# Patient Record
Sex: Male | Born: 1975 | Race: White | Hispanic: No | Marital: Married | Smoking: Former smoker
Health system: Southern US, Community
[De-identification: ages and names within clinical notes are randomized; demographics above are authoritative.]

## PROBLEM LIST (undated history)

## (undated) DIAGNOSIS — K219 Gastro-esophageal reflux disease without esophagitis: Secondary | ICD-10-CM

## (undated) HISTORY — PX: KNEE ARTHROSCOPY: SUR90

## (undated) HISTORY — PX: BILATERAL CARPAL TUNNEL RELEASE: SHX6508

---

## 2009-04-17 HISTORY — PX: SHOULDER ARTHROSCOPY: SHX128

## 2016-01-20 ENCOUNTER — Other Ambulatory Visit: Payer: Self-pay | Admitting: Surgery

## 2016-01-20 DIAGNOSIS — G8929 Other chronic pain: Secondary | ICD-10-CM

## 2016-01-20 DIAGNOSIS — M25512 Pain in left shoulder: Principal | ICD-10-CM

## 2016-01-24 ENCOUNTER — Other Ambulatory Visit: Payer: Self-pay | Admitting: Surgery

## 2016-01-24 ENCOUNTER — Other Ambulatory Visit (HOSPITAL_COMMUNITY): Payer: Self-pay | Admitting: Surgery

## 2016-01-24 DIAGNOSIS — S43432D Superior glenoid labrum lesion of left shoulder, subsequent encounter: Secondary | ICD-10-CM

## 2016-01-24 DIAGNOSIS — M25512 Pain in left shoulder: Principal | ICD-10-CM

## 2016-01-24 DIAGNOSIS — G8929 Other chronic pain: Secondary | ICD-10-CM

## 2016-02-02 ENCOUNTER — Ambulatory Visit
Admission: RE | Admit: 2016-02-02 | Discharge: 2016-02-02 | Disposition: A | Discharge status: Home / Self Care | Payer: BC Managed Care – PPO | Source: Ambulatory Visit | Attending: Surgery | Admitting: Surgery

## 2016-02-02 DIAGNOSIS — Z1389 Encounter for screening for other disorder: Secondary | ICD-10-CM | POA: Insufficient documentation

## 2016-02-02 DIAGNOSIS — G8929 Other chronic pain: Secondary | ICD-10-CM | POA: Diagnosis present

## 2016-02-02 DIAGNOSIS — M12812 Other specific arthropathies, not elsewhere classified, left shoulder: Secondary | ICD-10-CM | POA: Insufficient documentation

## 2016-02-02 DIAGNOSIS — M25512 Pain in left shoulder: Secondary | ICD-10-CM | POA: Diagnosis present

## 2016-02-02 DIAGNOSIS — S43432D Superior glenoid labrum lesion of left shoulder, subsequent encounter: Secondary | ICD-10-CM

## 2016-03-23 ENCOUNTER — Encounter
Admission: RE | Admit: 2016-03-23 | Discharge: 2016-03-23 | Disposition: A | Discharge status: Home / Self Care | Payer: BC Managed Care – PPO | Source: Ambulatory Visit | Attending: Surgery | Admitting: Surgery

## 2016-03-23 HISTORY — DX: Gastro-esophageal reflux disease without esophagitis: K21.9

## 2016-03-23 NOTE — Patient Instructions (Signed)
  Your procedure is scheduled on: 04-04-16 Report to Same Day Surgery 2nd floor medical mall [facility](Medical Mall Entrance-take elevator on left to 2nd floor.  Check in with surgery information desk.) To find out your arrival time please call [phone] between 1PM - 3PM on 04-03-16  Remember: Instructions that are not followed completely may result in serious medical risk, up to and including death, or upon the discretion of your surgeon and anesthesiologist your surgery may need to be rescheduled.    _x___ 1. Do not eat food or drink liquids after midnight. No gum chewing or hard candies.     __x__ 2. No Alcohol for 24 hours before or after surgery.   __x__3. No Smoking for 24 prior to surgery.   ____  4. Bring all medications with you on the day of surgery if instructed.    __x__ 5. Notify your doctor if there is any change in your medical condition     (cold, fever, infections).     Do not wear jewelry, make-up, hairpins, clips or nail polish.  Do not wear lotions, powders, or perfumes. You may wear deodorant.  Do not shave 48 hours prior to surgery. Men may shave face and neck.  Do not bring valuables to the hospital.    [facility] is not responsible for any belongings or valuables.               Contacts, dentures or bridgework may not be worn into surgery.  Leave your suitcase in the car. After surgery it may be brought to your room.  For patients admitted to the hospital, discharge time is determined by your treatment team.   Patients discharged the day of surgery will not be allowed to drive home.  You will need someone to drive you home and stay with you the night of your procedure.    Please read over the following fact sheets that you were given:   [facility] Preparing for Surgery and or MRSA Information   ____ Take these medicines the morning of surgery with A SIP OF WATER:    1. NONE  2.  3.  4.  5.  6.  ____Fleets enema or Magnesium Citrate as directed.   ____  Use CHG Soap or sage wipes as directed on instruction sheet   ____ Use inhalers on the day of surgery and bring to hospital day of surgery  ____ Stop metformin 2 days prior to surgery    ____ Take 1/2 of usual insulin dose the night before surgery and none on the morning of surgery.   ____ Stop Aspirin, Coumadin, Pllavix ,Eliquis, Effient, or Pradaxa  x__ Stop Anti-inflammatories such as Advil, Aleve, Ibuprofen, Motrin, Naproxen,          Naprosyn, Goodies powders or aspirin products. Ok to take Tylenol.   ____ Stop supplements until after surgery.    ____ Bring C-Pap to the hospital.

## 2016-03-27 ENCOUNTER — Inpatient Hospital Stay: Admission: RE | Admit: 2016-03-27 | Payer: BC Managed Care – PPO | Source: Ambulatory Visit

## 2016-04-04 ENCOUNTER — Ambulatory Visit
Admission: RE | Admit: 2016-04-04 | Discharge: 2016-04-04 | Disposition: A | Discharge status: Home / Self Care | Payer: BC Managed Care – PPO | Source: Ambulatory Visit | Attending: Surgery | Admitting: Surgery

## 2016-04-04 ENCOUNTER — Encounter: Payer: Self-pay | Admitting: *Deleted

## 2016-04-04 ENCOUNTER — Ambulatory Visit: Payer: BC Managed Care – PPO | Admitting: Anesthesiology

## 2016-04-04 ENCOUNTER — Encounter
Admission: RE | Disposition: A | Discharge status: Home / Self Care | Payer: Self-pay | Source: Ambulatory Visit | Attending: Surgery

## 2016-04-04 DIAGNOSIS — M75112 Incomplete rotator cuff tear or rupture of left shoulder, not specified as traumatic: Secondary | ICD-10-CM | POA: Insufficient documentation

## 2016-04-04 DIAGNOSIS — M7542 Impingement syndrome of left shoulder: Secondary | ICD-10-CM | POA: Diagnosis not present

## 2016-04-04 DIAGNOSIS — S43432A Superior glenoid labrum lesion of left shoulder, initial encounter: Secondary | ICD-10-CM | POA: Diagnosis not present

## 2016-04-04 DIAGNOSIS — M89512 Osteolysis, left shoulder: Secondary | ICD-10-CM | POA: Insufficient documentation

## 2016-04-04 DIAGNOSIS — Z87891 Personal history of nicotine dependence: Secondary | ICD-10-CM | POA: Diagnosis not present

## 2016-04-04 DIAGNOSIS — Z886 Allergy status to analgesic agent status: Secondary | ICD-10-CM | POA: Insufficient documentation

## 2016-04-04 DIAGNOSIS — X58XXXA Exposure to other specified factors, initial encounter: Secondary | ICD-10-CM | POA: Diagnosis not present

## 2016-04-04 DIAGNOSIS — K219 Gastro-esophageal reflux disease without esophagitis: Secondary | ICD-10-CM | POA: Insufficient documentation

## 2016-04-04 HISTORY — PX: SHOULDER ARTHROSCOPY WITH OPEN ROTATOR CUFF REPAIR: SHX6092

## 2016-04-04 SURGERY — ARTHROSCOPY, SHOULDER WITH REPAIR, ROTATOR CUFF, OPEN
Anesthesia: General | Laterality: Left | Wound class: Clean

## 2016-04-04 MED ORDER — OXYCODONE HCL 5 MG/5ML PO SOLN: 5.0000 mg | Freq: Once | ORAL | Status: DC | PRN

## 2016-04-04 MED ORDER — DEXAMETHASONE SODIUM PHOSPHATE 4 MG/ML IJ SOLN
INTRAMUSCULAR | Status: DC | PRN
  Administered 2016-04-04: 10 mg via INTRAVENOUS

## 2016-04-04 MED ORDER — FENTANYL CITRATE (PF) 100 MCG/2ML IJ SOLN
INTRAMUSCULAR | Status: DC | PRN
  Administered 2016-04-04: 250 ug via INTRAVENOUS

## 2016-04-04 MED ORDER — PHENYLEPHRINE HCL 10 MG/ML IJ SOLN
INTRAMUSCULAR | Status: DC | PRN
  Administered 2016-04-04 (×2): 100 ug via INTRAVENOUS

## 2016-04-04 MED ORDER — MIDAZOLAM HCL 2 MG/2ML IJ SOLN
INTRAMUSCULAR | Status: DC | PRN
  Administered 2016-04-04: 2 mg via INTRAVENOUS

## 2016-04-04 MED ORDER — OXYCODONE HCL 5 MG PO TABS: 5.0000 mg | ORAL_TABLET | Freq: Once | ORAL | Status: DC | PRN

## 2016-04-04 MED ORDER — HYDROMORPHONE HCL 1 MG/ML IJ SOLN
INTRAMUSCULAR | Status: DC | PRN
  Administered 2016-04-04: 2 mg via INTRAVENOUS

## 2016-04-04 MED ORDER — FENTANYL CITRATE (PF) 100 MCG/2ML IJ SOLN: 25.0000 ug | INTRAMUSCULAR | Status: DC | PRN

## 2016-04-04 MED ORDER — SUCCINYLCHOLINE CHLORIDE 20 MG/ML IJ SOLN
INTRAMUSCULAR | Status: DC | PRN
  Administered 2016-04-04: 100 mg via INTRAVENOUS

## 2016-04-04 MED ORDER — KETOROLAC TROMETHAMINE 30 MG/ML IJ SOLN
INTRAMUSCULAR | Status: DC | PRN
  Administered 2016-04-04: 30 mg via INTRAVENOUS

## 2016-04-04 MED ORDER — GLYCOPYRROLATE 0.2 MG/ML IJ SOLN
INTRAMUSCULAR | Status: DC | PRN
  Administered 2016-04-04: 0.4 mg via INTRAVENOUS

## 2016-04-04 MED ORDER — KETAMINE HCL 50 MG/ML IJ SOLN
INTRAMUSCULAR | Status: DC | PRN
  Administered 2016-04-04: 50 mg via INTRAVENOUS

## 2016-04-04 MED ORDER — ROCURONIUM BROMIDE 100 MG/10ML IV SOLN
INTRAVENOUS | Status: DC | PRN
  Administered 2016-04-04: 45 mg via INTRAVENOUS
  Administered 2016-04-04: 5 mg via INTRAVENOUS

## 2016-04-04 MED ORDER — FAMOTIDINE 20 MG PO TABS
ORAL_TABLET | ORAL | Status: AC
  Administered 2016-04-04: 20 mg via ORAL
  Filled 2016-04-04: qty 1

## 2016-04-04 MED ORDER — CEFAZOLIN SODIUM-DEXTROSE 2-4 GM/100ML-% IV SOLN
INTRAVENOUS | Status: AC
  Filled 2016-04-04: qty 100

## 2016-04-04 MED ORDER — BUPIVACAINE-EPINEPHRINE 0.5% -1:200000 IJ SOLN
INTRAMUSCULAR | Status: DC | PRN
  Administered 2016-04-04: 30 mL

## 2016-04-04 MED ORDER — LIDOCAINE HCL (PF) 4 % IJ SOLN
INTRAMUSCULAR | Status: DC | PRN
  Administered 2016-04-04: 4 mL via RESPIRATORY_TRACT

## 2016-04-04 MED ORDER — PROPOFOL 10 MG/ML IV BOLUS
INTRAVENOUS | Status: DC | PRN
Start: 2016-04-04 — End: 2016-04-04
  Administered 2016-04-04: 200 mg via INTRAVENOUS

## 2016-04-04 MED ORDER — NEOSTIGMINE METHYLSULFATE 10 MG/10ML IV SOLN
INTRAVENOUS | Status: DC | PRN
  Administered 2016-04-04: 3 mg via INTRAVENOUS

## 2016-04-04 MED ORDER — HYDROMORPHONE HCL 1 MG/ML IJ SOLN
INTRAMUSCULAR | Status: AC
  Filled 2016-04-04: qty 2

## 2016-04-04 MED ORDER — CEFAZOLIN SODIUM-DEXTROSE 2-4 GM/100ML-% IV SOLN
2.0000 g | Freq: Once | INTRAVENOUS | Status: AC
  Administered 2016-04-04: 2 g via INTRAVENOUS

## 2016-04-04 MED ORDER — EPINEPHRINE PF 1 MG/ML IJ SOLN
INTRAMUSCULAR | Status: AC
  Filled 2016-04-04: qty 3

## 2016-04-04 MED ORDER — LIDOCAINE HCL (CARDIAC) 20 MG/ML IV SOLN
INTRAVENOUS | Status: DC | PRN
  Administered 2016-04-04: 100 mg via INTRAVENOUS

## 2016-04-04 MED ORDER — LACTATED RINGERS IV SOLN
INTRAVENOUS | Status: DC
  Administered 2016-04-04: 50 mL/h via INTRAVENOUS

## 2016-04-04 MED ORDER — BUPIVACAINE HCL (PF) 0.5 % IJ SOLN
INTRAMUSCULAR | Status: AC
  Filled 2016-04-04: qty 30

## 2016-04-04 MED ORDER — FAMOTIDINE 20 MG PO TABS
20.0000 mg | ORAL_TABLET | Freq: Once | ORAL | Status: AC
  Administered 2016-04-04: 20 mg via ORAL

## 2016-04-04 MED ORDER — OXYCODONE HCL 5 MG PO TABS: 5.0000 mg | ORAL_TABLET | ORAL | 0 refills | Status: AC | PRN

## 2016-04-04 SURGICAL SUPPLY — 44 items
BIT DRILL JUGRKNT W/NDL BIT2.9 (DRILL) ×2 IMPLANT
BLADE FULL RADIUS 3.5 (BLADE) ×3 IMPLANT
BLADE SHAVER 4.5X7 STR FR (MISCELLANEOUS) ×3 IMPLANT
BUR ACROMIONIZER 4.0 (BURR) ×3 IMPLANT
BUR BR 5.5 WIDE MOUTH (BURR) ×3 IMPLANT
CANNULA SHAVER 8MMX76MM (CANNULA) ×3 IMPLANT
CHLORAPREP W/TINT 26ML (MISCELLANEOUS) ×6 IMPLANT
COVER MAYO STAND STRL (DRAPES) ×3 IMPLANT
DRAPE IMP U-DRAPE 54X76 (DRAPES) ×6 IMPLANT
DRILL JUGGERKNOT W/NDL BIT 2.9 (DRILL) ×6
DRSG OPSITE POSTOP 4X8 (GAUZE/BANDAGES/DRESSINGS) ×3 IMPLANT
ELECT REM PT RETURN 9FT ADLT (ELECTROSURGICAL) ×3
ELECTRODE REM PT RTRN 9FT ADLT (ELECTROSURGICAL) ×1 IMPLANT
GAUZE PETRO XEROFOAM 1X8 (MISCELLANEOUS) ×3 IMPLANT
GAUZE SPONGE 4X4 12PLY STRL (GAUZE/BANDAGES/DRESSINGS) ×3 IMPLANT
GLOVE BIO SURGEON STRL SZ7.5 (GLOVE) ×6 IMPLANT
GLOVE BIO SURGEON STRL SZ8 (GLOVE) ×6 IMPLANT
GLOVE BIOGEL PI IND STRL 8 (GLOVE) ×1 IMPLANT
GLOVE BIOGEL PI INDICATOR 8 (GLOVE) ×2
GLOVE INDICATOR 8.0 STRL GRN (GLOVE) ×3 IMPLANT
GOWN STRL REUS W/ TWL LRG LVL3 (GOWN DISPOSABLE) ×2 IMPLANT
GOWN STRL REUS W/ TWL XL LVL3 (GOWN DISPOSABLE) ×1 IMPLANT
GOWN STRL REUS W/TWL LRG LVL3 (GOWN DISPOSABLE) ×4
GOWN STRL REUS W/TWL XL LVL3 (GOWN DISPOSABLE) ×2
GRASPER SUT 15 45D LOW PRO (SUTURE) ×6 IMPLANT
IV LACTATED RINGER IRRG 3000ML (IV SOLUTION) ×4
IV LR IRRIG 3000ML ARTHROMATIC (IV SOLUTION) ×2 IMPLANT
MANIFOLD NEPTUNE II (INSTRUMENTS) ×3 IMPLANT
MASK FACE SPIDER DISP (MASK) ×3 IMPLANT
MAT BLUE FLOOR 46X72 FLO (MISCELLANEOUS) ×3 IMPLANT
NEEDLE REVERSE CUT 1/2 CRC (NEEDLE) ×3 IMPLANT
PACK ARTHROSCOPY SHOULDER (MISCELLANEOUS) ×3 IMPLANT
SLING ARM LRG DEEP (SOFTGOODS) ×3 IMPLANT
SLING ULTRA II LG (MISCELLANEOUS) ×3 IMPLANT
STAPLER SKIN PROX 35W (STAPLE) ×3 IMPLANT
STRAP SAFETY BODY (MISCELLANEOUS) ×3 IMPLANT
SUT ETHIBOND 0 MO6 C/R (SUTURE) ×3 IMPLANT
SUT VIC AB 2-0 CT1 27 (SUTURE) ×4
SUT VIC AB 2-0 CT1 TAPERPNT 27 (SUTURE) ×2 IMPLANT
TAPE MICROFOAM 4IN (TAPE) ×3 IMPLANT
TUBING ARTHRO INFLOW-ONLY STRL (TUBING) ×3 IMPLANT
TUBING CONNECTING 10 (TUBING) ×2 IMPLANT
TUBING CONNECTING 10' (TUBING) ×1
WAND HAND CNTRL MULTIVAC 90 (MISCELLANEOUS) ×3 IMPLANT

## 2016-04-04 NOTE — H&P (Signed)
Paper H&P to be scanned into permanent record. H&P reviewed. No changes. Cardiac: Regular rate and rhythm, no murmurs. Pulmonary: Lungs clear to auscultation; no wheezes, rhonchi, or rales.

## 2016-04-04 NOTE — Anesthesia Preprocedure Evaluation (Signed)
Anesthesia Evaluation  Patient identified by MRN, date of birth, ID band Patient awake    Reviewed: Allergy & Precautions, H&P , NPO status , Patient's Chart, lab work & pertinent test results  History of Anesthesia Complications Negative for: history of anesthetic complications  Airway Mallampati: II  TM Distance: >3 FB Neck ROM: full    Dental no notable dental hx. (+) Poor Dentition, Chipped   Pulmonary neg shortness of breath, former smoker,    Pulmonary exam normal breath sounds clear to auscultation       Cardiovascular Exercise Tolerance: Good (-) angina(-) Past MI and (-) DOE negative cardio ROS Normal cardiovascular exam Rhythm:regular Rate:Normal     Neuro/Psych negative neurological ROS  negative psych ROS   GI/Hepatic negative GI ROS, Neg liver ROS, GERD  Controlled,  Endo/Other  negative endocrine ROS  Renal/GU      Musculoskeletal   Abdominal   Peds  Hematology negative hematology ROS (+)   Anesthesia Other Findings Signs and symptoms suggestive of sleep apnea   Past Medical History: No date: GERD (gastroesophageal reflux disease)     Comment: OCC  Past Surgical History: No date: BILATERAL CARPAL TUNNEL RELEASE No date: KNEE ARTHROSCOPY Right     Comment: MULTIPLE KNEE SURGERIES 2011: SHOULDER ARTHROSCOPY Right  BMI    Body Mass Index:  39.20 kg/m      Reproductive/Obstetrics negative OB ROS                             Anesthesia Physical Anesthesia Plan  ASA: III  Anesthesia Plan: General ETT   Post-op Pain Management:    Induction:   Airway Management Planned:   Additional Equipment:   Intra-op Plan:   Post-operative Plan:   Informed Consent: I have reviewed the patients History and Physical, chart, labs and discussed the procedure including the risks, benefits and alternatives for the proposed anesthesia with the patient or authorized  representative who has indicated his/her understanding and acceptance.   Dental Advisory Given  Plan Discussed with: Anesthesiologist, CRNA and Surgeon  Anesthesia Plan Comments: (Patient declines preOp Nerve block)        Anesthesia Quick Evaluation

## 2016-04-04 NOTE — Discharge Instructions (Addendum)
Keep dressing dry and intact.  May shower after dressing changed on post-op day #4 (Saturday).  Cover staples with Band-Aids after drying off. Apply ice frequently to shoulder. Take Aleve 2 tabs BID with meals for 7-10 days, then as necessary. Take oxycodone as prescribed when needed.  Keep shoulder immobilizer on at all times except may remove for bathing purposes. Follow-up in 10-14 days or as scheduled.  AMBULATORY SURGERY  DISCHARGE INSTRUCTIONS   1) The drugs that you were given will stay in your system until tomorrow so for the next 24 hours you should not:  A) Drive an automobile B) Make any legal decisions C) Drink any alcoholic beverage   2) You may resume regular meals tomorrow.  Today it is better to start with liquids and gradually work up to solid foods.  You may eat anything you prefer, but it is better to start with liquids, then soup and crackers, and gradually work up to solid foods.   3) Please notify your doctor immediately if you have any unusual bleeding, trouble breathing, redness and pain at the surgery site, drainage, fever, or pain not relieved by medication.    4) Additional Instructions:        Please contact your physician with any problems or Same Day Surgery at [phone], Monday through Friday 6 am to 4 pm, or [place] at [facility] Main number at [phone].

## 2016-04-04 NOTE — Anesthesia Procedure Notes (Signed)
Procedure Name: Intubation Date/Time: 04/04/2016 1:15 PM Performed by: Rosaria Ferries, Myquan Schaumburg Pre-anesthesia Checklist: Patient identified, Emergency Drugs available, Suction available and Patient being monitored Patient Re-evaluated:Patient Re-evaluated prior to inductionOxygen Delivery Method: Circle system utilized Preoxygenation: Pre-oxygenation with 100% oxygen Intubation Type: IV induction Laryngoscope Size: Mac and 3 Grade View: Grade I Tube size: 7.0 mm Number of attempts: 1 Airway Equipment and Method: LTA kit utilized Placement Confirmation: ETT inserted through vocal cords under direct vision,  positive ETCO2 and breath sounds checked- equal and bilateral Secured at: 23 cm Tube secured with: Tape Dental Injury: Teeth and Oropharynx as per pre-operative assessment

## 2016-04-04 NOTE — Op Note (Signed)
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  Postoperative diagnosis: Impingement/tendinopathy with partial-thickness tears of supraspinatus and subscapularis tendons, labral fraying, and osteolysis of distal clavicle, left shoulder.  Procedure: Limited arthroscopic debridement of labrum and partial-thickness rotator cuff tears, arthroscopic subacromial decompression, and arthroscopic distal clavicle excision, left shoulder.  Anesthesia: General endotracheal with interscalene block placed preoperatively by the anesthesiologist.  Surgeon:   Maryagnes AmosJ. Jeffrey Poggi, MD  Assistant:   None  Findings: As above. There was moderate fraying of the superior portion of the labrum without frank detachment from the glenoid. The biceps tendon itself was in excellent condition. There was a small area of longitudinal partial-thickness tearing of the superior insertional fibers of the subscapularis tendon, as well as some mild fraying of the mid articular surface insertional fibers of the supraspinatus tendon. The articular surfaces both the humerus and glenoid were in excellent condition.  Complications: None  Fluids:   850 cc  Estimated blood loss: <5 cc  Tourniquet time: None  Drains: None  Closure: Staples   Brief clinical note: The patient is a 40 year old male with a history of left shoulder pain. The patient's symptoms have progressed despite medications, activity modification, etc. The patient's history and examination are consistent with impingement/tendinopathy with ostial lysis of the left distal clavicle. These findings were confirmed by MRI scan. The patient presents at this time for definitive management of his shoulder symptoms.  Procedure: The patient underwent placement of an interscalene block by the anesthesiologist in the preoperative holding  area by the anesthesiologist before he was brought into the operating room and lain in the supine position. The patient then underwent general endotracheal intubation and anesthesia before being repositioned in the beach chair position using the beach chair positioner. The left shoulder and upper extremity were prepped with ChloraPrep solution before being draped sterilely. Preoperative antibiotics were administered. A timeout was performed to confirm the proper surgical site before the expected portal sites and incision site were injected with 0.5% Sensorcaine with epinephrine. A posterior portal was created and the glenohumeral joint thoroughly inspected with the findings as described above. An anterior portal was created using an outside-in technique. The labrum and rotator cuff were further probed, again confirming the above-noted findings. The areas of labral fraying were debrided back to stable margins using the full-radius resector, as were the areas of partial-thickness tearing of the supraspinatus and subscapularis tendons. The ArthroCare wand was inserted and used to obtain hemostasis as well as to "anneal" the labrum superiorly and anteriorly. The instruments were removed from the joint after suctioning the excess fluid.  The camera was repositioned through the posterior portal into the subacromial space. A separate lateral portal was created using an outside-in technique. The 3.5 mm full-radius resector was introduced and used to perform a subtotal bursectomy. The ArthroCare wand was then inserted and used to remove the periosteal tissue off the undersurface of the anterior third of the acromion as well as to recess the coracoacromial ligament from its attachment along the anterior and lateral margins of the acromion. The 4.0 mm acromionizing bur was introduced and used to complete the decompression by removing the undersurface of the anterior third of the acromion.   The camera was repositioned in  the lateral portal and the 4 mm acromionizer bur introduced through the anterior portal. Under arthroscopic visualization, the distal 8-10 millimeters of the clavicle was excised.  Care was taken to be sure to carry the excision superiorly and posteriorly so as to not leave any bony prominences that might later become symptomatic. The camera was repositioned in the anterior portal to better assess the adequacy of distal clavicle excision. Once adequate bone had been removed, the camera was repositioned in the lateral portal. The full radius resector was reintroduced to remove any residual bony debris before the ArthroCare wand was reintroduced to obtain hemostasis. The instruments were then removed from the subacromial space after suctioning the excess fluid.  The portal sites were closed using staples. A sterile bulky dressing was applied to the shoulder before the arm was placed into a standard shoulder sling. The patient was then awakened, extubated, and returned to the recovery room in satisfactory condition after tolerating the procedure well.

## 2016-04-04 NOTE — Transfer of Care (Signed)
Immediate Anesthesia Transfer of Care Note  Patient: Andres Patel  Procedure(s) Performed: Procedure(s): SHOULDER ARTHROSCOPY WITH OPEN ROTATOR CUFF REPAIR (Left)  Patient Location: PACU  Anesthesia Type:General  Level of Consciousness: awake, alert , oriented and patient cooperative  Airway & Oxygen Therapy: Patient Spontanous Breathing and Patient connected to nasal cannula oxygen  Post-op Assessment: Report given to RN and Post -op Vital signs reviewed and stable  Post vital signs: Reviewed and stable  Last Vitals:  Vitals:   04/04/16 1136 04/04/16 1142  BP: 136/69   Pulse: 70   Resp: 18   Temp:  37.1 C    Last Pain:  Vitals:   04/04/16 1142  TempSrc: Oral  PainSc:       Patients Stated Pain Goal: 1 (04/04/16 1213)  Complications: No apparent anesthesia complications

## 2016-04-05 NOTE — Anesthesia Postprocedure Evaluation (Signed)
Anesthesia Post Note  Patient: Andres Patel  Procedure(s) Performed: Procedure(s) (LRB): SHOULDER ARTHROSCOPY WITH OPEN ROTATOR CUFF REPAIR (Left)  Patient location during evaluation: PACU Anesthesia Type: General Level of consciousness: awake and alert Pain management: pain level controlled Vital Signs Assessment: post-procedure vital signs reviewed and stable Respiratory status: spontaneous breathing, nonlabored ventilation, respiratory function stable and patient connected to nasal cannula oxygen Cardiovascular status: blood pressure returned to baseline and stable Postop Assessment: no signs of nausea or vomiting Anesthetic complications: no     Last Vitals:  Vitals:   04/04/16 1540 04/04/16 1640  BP: 138/63 123/65  Pulse: 62 61  Resp: 16 16  Temp: 36.2 C     Last Pain:  Vitals:   04/04/16 1142  TempSrc: Oral  PainSc:                  Cleda MccreedyJoseph K Piscitello

## 2016-04-07 ENCOUNTER — Encounter: Payer: Self-pay | Admitting: Surgery

## 2016-04-20 ENCOUNTER — Emergency Department
Admission: EM | Admit: 2016-04-20 | Discharge: 2016-04-20 | Disposition: A | Discharge status: Home / Self Care | Payer: Managed Care, Other (non HMO) | Attending: Student in an Organized Health Care Education/Training Program | Admitting: Student in an Organized Health Care Education/Training Program

## 2016-04-20 ENCOUNTER — Ambulatory Visit
Admission: EM | Admit: 2016-04-20 | Discharge: 2016-04-20 | Disposition: A | Discharge status: Other Acute Inpatient Hospital | Payer: BC Managed Care – PPO | Attending: Family Medicine | Admitting: Family Medicine

## 2016-04-20 ENCOUNTER — Encounter: Payer: Self-pay | Admitting: Emergency Medicine

## 2016-04-20 DIAGNOSIS — L089 Local infection of the skin and subcutaneous tissue, unspecified: Secondary | ICD-10-CM

## 2016-04-20 DIAGNOSIS — Z87891 Personal history of nicotine dependence: Secondary | ICD-10-CM | POA: Insufficient documentation

## 2016-04-20 DIAGNOSIS — B958 Unspecified staphylococcus as the cause of diseases classified elsewhere: Secondary | ICD-10-CM

## 2016-04-20 DIAGNOSIS — A4902 Methicillin resistant Staphylococcus aureus infection, unspecified site: Secondary | ICD-10-CM | POA: Insufficient documentation

## 2016-04-20 DIAGNOSIS — G510 Bell's palsy: Secondary | ICD-10-CM | POA: Insufficient documentation

## 2016-04-20 DIAGNOSIS — R21 Rash and other nonspecific skin eruption: Secondary | ICD-10-CM

## 2016-04-20 DIAGNOSIS — Z79899 Other long term (current) drug therapy: Secondary | ICD-10-CM | POA: Insufficient documentation

## 2016-04-20 DIAGNOSIS — R29810 Facial weakness: Secondary | ICD-10-CM

## 2016-04-20 MED ORDER — VALACYCLOVIR HCL 1 G PO TABS: 1000.0000 mg | ORAL_TABLET | Freq: Three times a day (TID) | ORAL | 0 refills | Status: AC

## 2016-04-20 MED ORDER — DOXYCYCLINE HYCLATE 100 MG PO CAPS: 100.0000 mg | ORAL_CAPSULE | Freq: Two times a day (BID) | ORAL | 0 refills | Status: AC

## 2016-04-20 MED ORDER — PREDNISONE 10 MG (21) PO TBPK: ORAL_TABLET | ORAL | 0 refills | Status: AC

## 2016-04-20 MED ORDER — VALACYCLOVIR HCL 500 MG PO TABS
1000.0000 mg | ORAL_TABLET | Freq: Once | ORAL | Status: AC
  Administered 2016-04-20: 1000 mg via ORAL
  Filled 2016-04-20: qty 2

## 2016-04-20 MED ORDER — PREDNISONE 20 MG PO TABS
60.0000 mg | ORAL_TABLET | Freq: Once | ORAL | Status: AC
  Administered 2016-04-20: 60 mg via ORAL
  Filled 2016-04-20: qty 3

## 2016-04-20 NOTE — ED Notes (Signed)

## 2016-04-20 NOTE — Discharge Instructions (Signed)
Follow up with the primary care provider of your choice within a week for a recheck.  Use lubricating eye drops often throughout the day and protect your eyes with safety glasses.  Return to the ER for any symptom that changes or worsens if you are unable to schedule an appointment.

## 2016-04-20 NOTE — ED Provider Notes (Signed)
[facility]    CSN: [id number] Arrival date & time: 04/20/16  1913     History   Chief Complaint Chief Complaint  Patient presents with  . Rash    HPI Andres Patel is a 41 y.o. male.   Patient is 41 year old white male had left shoulder surgery mid-December. Since then he's had some consultation is left shoulder and including an infection requiring Keflex. He reports that currently admitted to before new years he started breaking of the bumps on the right side of his neck. These bumps now has spread from his neck to space and only have a spread to space his wife noticed that he has right facial weakness this evening and excessive blinking of his left eye. No fever no tenderness or pain with the weakness is very pronounced.   The history is provided by the patient and the spouse. No language interpreter was used.  Rash  Location:  Face and head/neck Head/neck rash location:  Head, R ear and R neck Facial rash location:  Face, R cheek, upper lip and chin Quality: blistering, draining and redness   Severity:  Moderate Progression:  Worsening Chronicity:  New Context: medications   Relieved by:  Nothing Worsened by:  Nothing Ineffective treatments:  None tried   Past Medical History:  Diagnosis Date  . GERD (gastroesophageal reflux disease)    OCC    There are no active problems to display for this patient.   Past Surgical History:  Procedure Laterality Date  . BILATERAL CARPAL TUNNEL RELEASE    . KNEE ARTHROSCOPY Right    MULTIPLE KNEE SURGERIES  . SHOULDER ARTHROSCOPY Right 2011  . SHOULDER ARTHROSCOPY WITH OPEN ROTATOR CUFF REPAIR Left 04/04/2016   Procedure: SHOULDER ARTHROSCOPY WITH OPEN ROTATOR CUFF REPAIR;  Surgeon: Christena Flake, MD;  Location: [facility] ORS;  Service: Orthopedics;  Laterality: Left;       Home Medications    Prior to Admission medications   Medication Sig Start Date End Date Taking? Authorizing Provider  calcium carbonate  (TUMS - DOSED IN MG ELEMENTAL CALCIUM) 500 MG chewable tablet Chew 1 tablet by mouth as needed for indigestion or heartburn.   Yes Historical Provider, MD  oxyCODONE (ROXICODONE) 5 MG immediate release tablet Take 1-2 tablets (5-10 mg total) by mouth every 4 (four) hours as needed for severe pain. 04/04/16  Yes Christena Flake, MD    Family History History reviewed. No pertinent family history.  Social History Social History  Substance Use Topics  . Smoking status: Former Smoker    Packs/day: 1.50    Years: 15.00    Types: Cigarettes    Quit date: 03/24/2015  . Smokeless tobacco: Never Used  . Alcohol use Yes     Comment: OCC     Allergies   Tylenol [acetaminophen] and Clove [syzygium aromaticum]   Review of Systems Review of Systems  Constitutional: Positive for activity change.  HENT: Positive for facial swelling. Negative for congestion.   Skin: Positive for rash.  Neurological: Positive for facial asymmetry.  All other systems reviewed and are negative.    Physical Exam Triage Vital Signs ED Triage Vitals  Enc Vitals Group     BP 04/20/16 2031 137/79     Pulse Rate 04/20/16 2031 87     Resp 04/20/16 2031 18     Temp 04/20/16 2031 98.6 F (37 C)     Temp Source 04/20/16 2031 Oral     SpO2 04/20/16 2031 98 %  Weight 04/20/16 2029 289 lb (131.1 kg)     Height 04/20/16 2029 6' (1.829 m)     Head Circumference --      Peak Flow --      Pain Score 04/20/16 2030 4     Pain Loc --      Pain Edu? --      Excl. in GC? --    No data found.   Updated Vital Signs BP 137/79 (BP Location: Left Arm)   Pulse 87   Temp 98.6 F (37 C) (Oral)   Resp 18   Ht 6' (1.829 m)   Wt 289 lb (131.1 kg)   SpO2 98%   BMI 39.20 kg/m   Visual Acuity Right Eye Distance:   Left Eye Distance:   Bilateral Distance:    Right Eye Near:   Left Eye Near:    Bilateral Near:     Physical Exam  Constitutional: He appears well-developed and well-nourished.  HENT:  Head:  Normocephalic and atraumatic.  Right Ear: Hearing, tympanic membrane, external ear and ear canal normal.  Left Ear: Hearing, tympanic membrane, external ear and ear canal normal.  Nose: Sinus tenderness present. No mucosal edema or septal deviation. Right sinus exhibits no maxillary sinus tenderness and no frontal sinus tenderness. Left sinus exhibits no maxillary sinus tenderness and no frontal sinus tenderness.    Mouth/Throat: Uvula is midline and oropharynx is clear and moist. He does not have dentures. Normal dentition.  Eyes: Pupils are equal, round, and reactive to light.  Neck: Normal range of motion. Neck supple.  Pulmonary/Chest: Effort normal.  Musculoskeletal: Normal range of motion. He exhibits no edema or tenderness.  Lymphadenopathy:    He has cervical adenopathy.  Neurological: He is alert. A cranial nerve deficit is present.  Patient has right sided facial weakness and recurrent blinking of the left eye. He has multiple vesicles on the neck chin over the mandible and the neck and the shoulder there is muscle weakness on the right side of face as well  Skin: Rash noted. There is erythema.  Psychiatric: He has a normal mood and affect.  Vitals reviewed.    UC Treatments / Results  Labs (all labs ordered are listed, but only abnormal results are displayed) Labs Reviewed - No data to display  EKG  EKG Interpretation None       Radiology No results found.  Procedures Procedures (including critical care time)  Medications Ordered in UC Medications - No data to display   Initial Impression / Assessment and Plan / UC Course  I have reviewed the triage vital signs and the nursing notes.  Pertinent labs & imaging results that were available during my care of the patient were reviewed by me and considered in my medical decision making (see chart for details).  Clinical Course    Patient was informed with the right sided facial weakness that was present with a  rash in the left eye blinking excessively I think that he is ready to have a CT scan may be of an MRI. He felt the need white count and other tests to evaluate with this is trigeminal neuralgia with a Bell palsy or some other problems causing severe lateral findings on the left side versus right symptoms. I strongly suggested to go to the ED of the choice which is going to be [facility] to be evaluated possible CT scan or MRI discussed Sue LushAndrea charge nurse at [facility] and she is aware of the  eminent arrival   Final Clinical Impressions(s) / UC Diagnoses   Final diagnoses:  Rash  Mouth droop due to facial weakness    New Prescriptions Discharge Medication List as of 04/20/2016  9:07 PM     Note: This dictation was prepared with Dragon dictation along with smaller phrase technology. Any transcriptional errors that result from this process are unintentional.   Hassan Rowan, MD 04/20/16 2117

## 2016-04-20 NOTE — ED Triage Notes (Signed)
Pt had surgery in December on his left shoulder, and he developed a rash on his neck and face on the right side. He isnt blinking with his right eye and it appears to be swollen. He was given cephalaxin for his rash however it isnt helping its getting worse. He cant hear good out of his right ear.

## 2016-04-20 NOTE — ED Triage Notes (Addendum)
Patient ambulatory to triage with steady gait, without difficulty or distress noted; [facility] Bell's Pals[facility]; no right eye blink/facial movement noted; rx keflex 4 days ago for rash to right side of neck/shoulder and now drying up; [facility] and sent [facility] for further eval; reviewed c/o with Dr Cyril LoosenKinner; no protocols to be done and pt moved to flex wait per MD

## 2016-04-20 NOTE — ED Provider Notes (Signed)
[facility] Emergency Department Provider Note  ____________________________________________  Time seen: Approximately 10:28 PM  I have reviewed the triage vital signs and the nursing notes.   HISTORY  Chief Complaint Rash   HPI Andres Patel is a 41 y.o. male who presents to the emergency department for evaluation of a rash to the right side of his neck and right shoulder that has not improved with Keflex. He also presents for right side facial drooping that involves the right eye and forehead. These symptoms started gradually throughout the day, but more pronounced this evening while eating supper. He was evaluated at urgent care and was encouraged to come to the emergency department.  He and his wife state that he had shoulder surgery in December and has developed a staph infection under the sling that worsened after he shaved his neck. Keflex has nothelped with the rash. He states it feels like a burning sensation.  Tonight, he states that his food didn't have much of the taste and he has noticed that his hearing in his right ear is muffled.  Past Medical History:  Diagnosis Date  . GERD (gastroesophageal reflux disease)    OCC    There are no active problems to display for this patient.   Past Surgical History:  Procedure Laterality Date  . BILATERAL CARPAL TUNNEL RELEASE    . KNEE ARTHROSCOPY Right    MULTIPLE KNEE SURGERIES  . SHOULDER ARTHROSCOPY Right 2011  . SHOULDER ARTHROSCOPY WITH OPEN ROTATOR CUFF REPAIR Left 04/04/2016   Procedure: SHOULDER ARTHROSCOPY WITH OPEN ROTATOR CUFF REPAIR;  Surgeon: Christena Flake, MD;  Location: [facility] ORS;  Service: Orthopedics;  Laterality: Left;    Prior to Admission medications   Medication Sig Start Date End Date Taking? Authorizing Provider  calcium carbonate (TUMS - DOSED IN MG ELEMENTAL CALCIUM) 500 MG chewable tablet Chew 1 tablet by mouth as needed for indigestion or heartburn.    Historical Provider,  MD  doxycycline (VIBRAMYCIN) 100 MG capsule Take 1 capsule (100 mg total) by mouth 2 (two) times daily. 04/20/16   Chinita Pester, FNP  oxyCODONE (ROXICODONE) 5 MG immediate release tablet Take 1-2 tablets (5-10 mg total) by mouth every 4 (four) hours as needed for severe pain. 04/04/16   Christena Flake, MD  predniSONE (STERAPRED UNI-PAK 21 TAB) 10 MG (21) TBPK tablet Take 6 tablets on day 1 Take 5 tablets on day 2 Take 4 tablets on day 3 Take 3 tablets on day 4 Take 2 tablets on day 5 Take 1 tablet on day 6 04/20/16   Terrell Shimko B Birtie Fellman, FNP  valACYclovir (VALTREX) 1000 MG tablet Take 1 tablet (1,000 mg total) by mouth 3 (three) times daily. 04/20/16   Chinita Pester, FNP    Allergies Tylenol [acetaminophen] and Clove [syzygium aromaticum]  No family history on file.  Social History Social History  Substance Use Topics  . Smoking status: Former Smoker    Packs/day: 1.50    Years: 15.00    Types: Cigarettes    Quit date: 03/24/2015  . Smokeless tobacco: Never Used  . Alcohol use Yes     Comment: OCC    Review of Systems  Constitutional: Negative for fever/chills Respiratory: Negative for shortness of breath. Musculoskeletal: Negative for pain. Skin: Positive for rash Neurological: Positive for right side facial droop and inability to fully close the right eye. Denies headache. ____________________________________________   PHYSICAL EXAM:  VITAL SIGNS: ED Triage Vitals  Enc Vitals Group  BP 04/20/16 2144 137/87     Pulse Rate 04/20/16 2144 85     Resp 04/20/16 2144 18     Temp 04/20/16 2144 97.9 F (36.6 C)     Temp Source 04/20/16 2144 Oral     SpO2 04/20/16 2144 97 %     Weight 04/20/16 2146 289 lb (131.1 kg)     Height 04/20/16 2146 6' (1.829 m)     Head Circumference --      Peak Flow --      Pain Score 04/20/16 2137 3     Pain Loc --      Pain Edu? --      Excl. in GC? --      Constitutional: Alert and oriented. Well appearing and in no acute  distress. Eyes: Conjunctivae are normal. EOMI. Nose: No congestion/rhinnorhea. Mouth/Throat: Mucous membranes are moist.   Neck: No stridor. Lymphatic: No cervical lymphadenopathy. Cardiovascular: Good peripheral circulation. Respiratory: Normal respiratory effort.  No retractions. Lungs clear. Musculoskeletal: FROM throughout. Neurologic:  Patient unable to raise the right eyebrow or completely close the right eye, right side mouth droop present. He has equal grip strength bilaterally. No difficulty swallowing. Steady gait.  Skin:  Scattered, erythematous scabbed rash noted about the neck in the hairline that was recently shaved as well as on the right shoulder but does not appear to follow any dermatome.   ____________________________________________   LABS (all labs ordered are listed, but only abnormal results are displayed)  Labs Reviewed - No data to display ____________________________________________  EKG  Not indicated. ____________________________________________  RADIOLOGY  Not indicated. ____________________________________________   PROCEDURES  Procedure(s) performed: None ____________________________________________   INITIAL IMPRESSION / ASSESSMENT AND PLAN / ED COURSE  Clinical Course     Pertinent labs & imaging results that were available during my care of the patient were reviewed by me and considered in my medical decision making (see chart for details).  41 year old male presenting to the emergency department for evaluation of rash and symptoms most likely consistent with Bell's palsy. The rash appears to be a separate problem as it started several days ago and does not follow any dermatome. The rash will be treated with doxycycline and the patient was advised to stop the Keflex. For the Bell's palsy, he will be treated with valacyclovir and prednisone since his presentation was well within the 72 hour window. Also, he was advised to take care and not  scratch his right eye. He was advised to purchase lubricating drops and use them frequently throughout the day. He was given strict return precautions and was strongly advised to establish primary care for follow-up in about a week. If any symptom changes or worsens, he has agreed to return to the emergency department. ____________________________________________   FINAL CLINICAL IMPRESSION(S) / ED DIAGNOSES  Final diagnoses:  Right-sided Bell's palsy  Staph skin infection    Discharge Medication List as of 04/20/2016 11:00 PM    START taking these medications   Details  doxycycline (VIBRAMYCIN) 100 MG capsule Take 1 capsule (100 mg total) by mouth 2 (two) times daily., Starting Thu 04/20/2016, Print    predniSONE (STERAPRED UNI-PAK 21 TAB) 10 MG (21) TBPK tablet Take 6 tablets on day 1 Take 5 tablets on day 2 Take 4 tablets on day 3 Take 3 tablets on day 4 Take 2 tablets on day 5 Take 1 tablet on day 6, Print    valACYclovir (VALTREX) 1000 MG tablet Take 1 tablet (1,000 mg total)  by mouth 3 (three) times daily., Starting Thu 04/20/2016, Print        Note:  This document was prepared using Dragon voice recognition software and may include unintentional dictation errors.    Chinita PesterCari B Blakley Michna, FNP 04/21/16 0010    Willy EddyPatrick Robinson, MD 04/21/16 2056

## 2018-03-27 IMAGING — CR DG ORBITS FOR FOREIGN BODY
2 series · 2 of 2 positions shown · non-contrast
Comparison: None.

CLINICAL DATA: Metal working/exposure; clearance prior to MRI

EXAM:
ORBITS FOR FOREIGN BODY - 2 VIEW

[facial waters (1 of 2)]
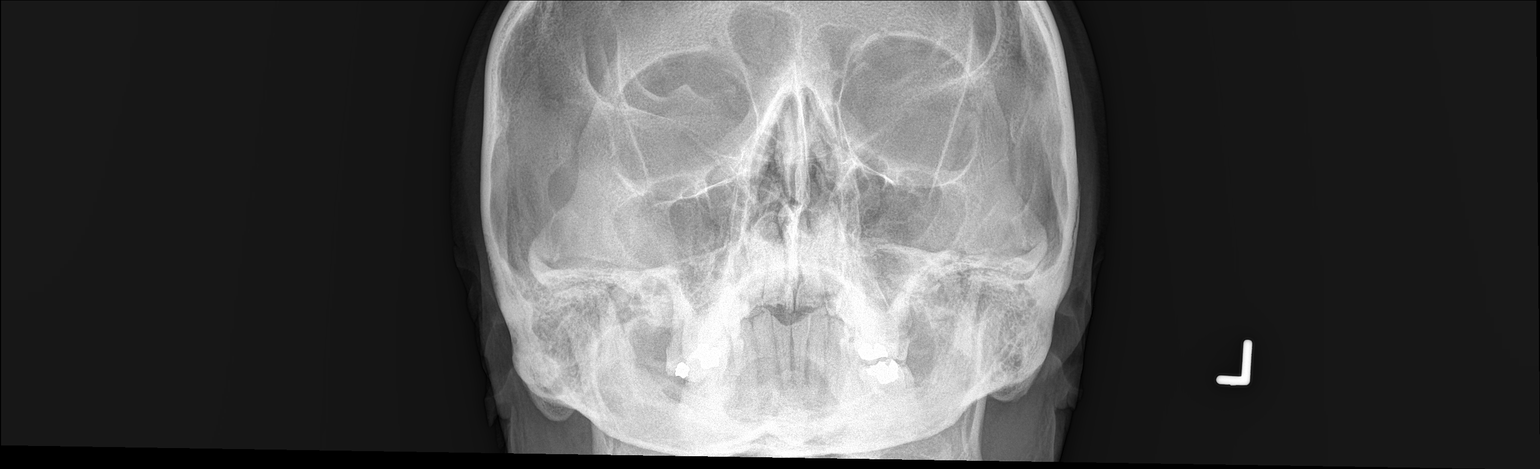

[facial waters (2 of 2)]
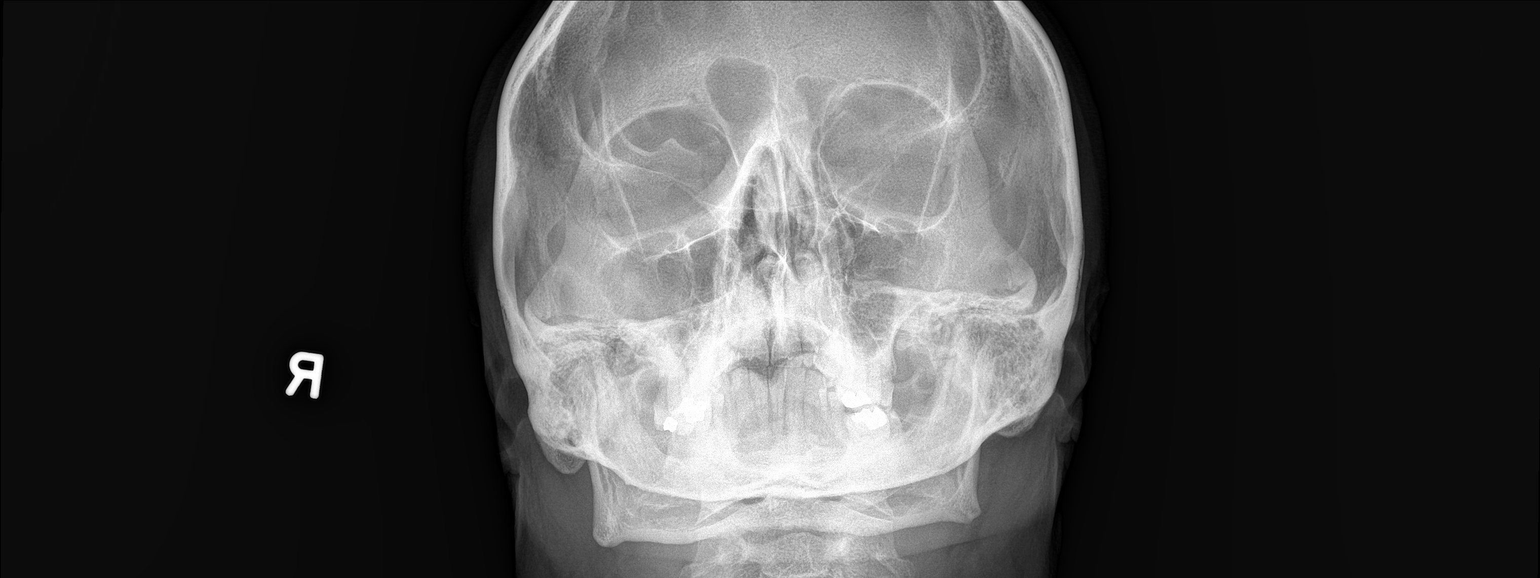

[2 of 2 positions shown; findings below may reference images not displayed]

FINDINGS: There is no evidence of metallic foreign body within the orbits. No
significant bone abnormality identified.
IMPRESSION: No evidence of metallic foreign body within the orbits.
